# Patient Record
Sex: Male | Born: 1975 | Race: White | Hispanic: Yes | Marital: Married | Smoking: Current every day smoker
Health system: Southern US, Community
[De-identification: ages and names within clinical notes are randomized; demographics above are authoritative.]

## PROBLEM LIST (undated history)

## (undated) DIAGNOSIS — J45909 Unspecified asthma, uncomplicated: Secondary | ICD-10-CM

## (undated) HISTORY — DX: Unspecified asthma, uncomplicated: J45.909

---

## 2015-08-08 ENCOUNTER — Ambulatory Visit (INDEPENDENT_AMBULATORY_CARE_PROVIDER_SITE_OTHER): Payer: Managed Care, Other (non HMO) | Admitting: Family Medicine

## 2015-08-08 VITALS — BP 110/74 | HR 66 | Temp 98.5°F | Resp 16 | Ht 69.0 in | Wt 162.4 lb

## 2015-08-08 DIAGNOSIS — Z720 Tobacco use: Secondary | ICD-10-CM | POA: Diagnosis not present

## 2015-08-08 DIAGNOSIS — J4521 Mild intermittent asthma with (acute) exacerbation: Secondary | ICD-10-CM | POA: Diagnosis not present

## 2015-08-08 DIAGNOSIS — J209 Acute bronchitis, unspecified: Secondary | ICD-10-CM

## 2015-08-08 MED ORDER — AMOXICILLIN 875 MG PO TABS: 875.0000 mg | ORAL_TABLET | Freq: Two times a day (BID) | ORAL | Status: AC

## 2015-08-08 MED ORDER — PREDNISONE 20 MG PO TABS: 40.0000 mg | ORAL_TABLET | Freq: Every day | ORAL | Status: AC

## 2015-08-08 MED ORDER — IPRATROPIUM BROMIDE 0.02 % IN SOLN
0.5000 mg | Freq: Once | RESPIRATORY_TRACT | Status: AC
  Administered 2015-08-08: 0.5 mg via RESPIRATORY_TRACT

## 2015-08-08 MED ORDER — ALBUTEROL SULFATE (2.5 MG/3ML) 0.083% IN NEBU
2.5000 mg | INHALATION_SOLUTION | Freq: Once | RESPIRATORY_TRACT | Status: AC
  Administered 2015-08-08: 2.5 mg via RESPIRATORY_TRACT

## 2015-08-08 MED ORDER — ALBUTEROL SULFATE 108 (90 BASE) MCG/ACT IN AEPB: 2.0000 | INHALATION_SPRAY | RESPIRATORY_TRACT | Status: AC | PRN

## 2015-08-08 MED ORDER — HYDROCOD POLST-CPM POLST ER 10-8 MG/5ML PO SUER: 5.0000 mL | Freq: Two times a day (BID) | ORAL | Status: DC | PRN

## 2015-08-08 NOTE — Patient Instructions (Signed)
Acute Bronchitis °Bronchitis is inflammation of the airways that extend from the windpipe into the lungs (bronchi). The inflammation often causes mucus to develop. This leads to a cough, which is the most common symptom of bronchitis.  °In acute bronchitis, the condition usually develops suddenly and goes away over time, usually in a couple weeks. Smoking, allergies, and asthma can make bronchitis worse. Repeated episodes of bronchitis may cause further lung problems.  °CAUSES °Acute bronchitis is most often caused by the same virus that causes a cold. The virus can spread from person to person (contagious) through coughing, sneezing, and touching contaminated objects. °SIGNS AND SYMPTOMS  °· Cough.   °· Fever.   °· Coughing up mucus.   °· Body aches.   °· Chest congestion.   °· Chills.   °· Shortness of breath.   °· Sore throat.   °DIAGNOSIS  °Acute bronchitis is usually diagnosed through a physical exam. Your health care provider will also ask you questions about your medical history. Tests, such as chest X-rays, are sometimes done to rule out other conditions.  °TREATMENT  °Acute bronchitis usually goes away in a couple weeks. Oftentimes, no medical treatment is necessary. Medicines are sometimes given for relief of fever or cough. Antibiotic medicines are usually not needed but may be prescribed in certain situations. In some cases, an inhaler may be recommended to help reduce shortness of breath and control the cough. A cool mist vaporizer may also be used to help thin bronchial secretions and make it easier to clear the chest.  °HOME CARE INSTRUCTIONS °· Get plenty of rest.   °· Drink enough fluids to keep your urine clear or pale yellow (unless you have a medical condition that requires fluid restriction). Increasing fluids may help thin your respiratory secretions (sputum) and reduce chest congestion, and it will prevent dehydration.   °· Take medicines only as directed by your health care provider. °· If  you were prescribed an antibiotic medicine, finish it all even if you start to feel better. °· Avoid smoking and secondhand smoke. Exposure to cigarette smoke or irritating chemicals will make bronchitis worse. If you are a smoker, consider using nicotine gum or skin patches to help control withdrawal symptoms. Quitting smoking will help your lungs heal faster.   °· Reduce the chances of another bout of acute bronchitis by washing your hands frequently, avoiding people with cold symptoms, and trying not to touch your hands to your mouth, nose, or eyes.   °· Keep all follow-up visits as directed by your health care provider.   °SEEK MEDICAL CARE IF: °Your symptoms do not improve after 1 week of treatment.  °SEEK IMMEDIATE MEDICAL CARE IF: °· You develop an increased fever or chills.   °· You have chest pain.   °· You have severe shortness of breath. °· You have bloody sputum.   °· You develop dehydration. °· You faint or repeatedly feel like you are going to pass out. °· You develop repeated vomiting. °· You develop a severe headache. °MAKE SURE YOU:  °· Understand these instructions. °· Will watch your condition. °· Will get help right away if you are not doing well or get worse. °  °This information is not intended to replace advice given to you by your health care provider. Make sure you discuss any questions you have with your health care provider. °  °Document Released: 09/06/2004 Document Revised: 08/20/2014 Document Reviewed: 01/20/2013 °Elsevier Interactive Patient Education ©2016 [employer] ° °Chronic Obstructive Pulmonary Disease Exacerbation °Chronic obstructive pulmonary disease (COPD) is a common lung condition in which airflow   from the lungs is limited. COPD is a general term that can be used to describe many different lung problems that limit airflow, including chronic bronchitis and emphysema. COPD exacerbations are episodes when breathing symptoms become much worse and require extra treatment.  Without treatment, COPD exacerbations can be life threatening, and frequent COPD exacerbations can cause further damage to your lungs. °CAUSES °· Respiratory infections. °· Exposure to smoke. °· Exposure to air pollution, chemical fumes, or dust. °Sometimes there is no apparent cause or trigger. °RISK FACTORS °· Smoking cigarettes. °· Older age. °· Frequent prior COPD exacerbations. °SIGNS AND SYMPTOMS °· Increased coughing. °· Increased thick spit (sputum) production. °· Increased wheezing. °· Increased shortness of breath. °· Rapid breathing. °· Chest tightness. °DIAGNOSIS °Your medical history, a physical exam, and tests will help your health care provider make a diagnosis. Tests may include: °· A chest X-ray. °· Basic lab tests. °· Sputum testing. °· An arterial blood gas test. °TREATMENT °Depending on the severity of your COPD exacerbation, you may need to be admitted to a hospital for treatment. Some of the treatments commonly used to treat COPD exacerbations are:  °· Antibiotic medicines. °· Bronchodilators. These are drugs that expand the air passages. They may be given with an inhaler or nebulizer. Spacer devices may be needed to help improve drug delivery. °· Corticosteroid medicines. °· Supplemental oxygen therapy. °· Airway clearing techniques, such as noninvasive ventilation (NIV) and positive expiratory pressure (PEP). These provide respiratory support through a mask or other noninvasive device. °HOME CARE INSTRUCTIONS °· Do not smoke. Quitting smoking is very important to prevent COPD from getting worse and exacerbations from happening as often. °· Avoid exposure to all substances that irritate the airway, especially to tobacco smoke. °· If you were prescribed an antibiotic medicine, finish it all even if you start to feel better. °· Take all medicines as directed by your health care provider. It is important to use correct technique with inhaled medicines. °· Drink enough fluids to keep your urine  clear or pale yellow (unless you have a medical condition that requires fluid restriction). °· Use a cool mist vaporizer. This makes it easier to clear your chest when you cough. °· If you have a home nebulizer and oxygen, continue to use them as directed. °· Maintain all necessary vaccinations to prevent infections. °· Exercise regularly. °· Eat a healthy diet. °· Keep all follow-up appointments as directed by your health care provider. °SEEK IMMEDIATE MEDICAL CARE IF: °· You have worsening shortness of breath. °· You have trouble talking. °· You have severe chest pain. °· You have blood in your sputum. °· You have a fever. °· You have weakness, vomit repeatedly, or faint. °· You feel confused. °· You continue to get worse. °MAKE SURE YOU: °· Understand these instructions. °· Will watch your condition. °· Will get help right away if you are not doing well or get worse. °  °This information is not intended to replace advice given to you by your health care provider. Make sure you discuss any questions you have with your health care provider. °  °Document Released: 05/27/2007 Document Revised: 08/20/2014 Document Reviewed: 04/03/2013 °Elsevier Interactive Patient Education ©2016 Elsevier Inc. ° °

## 2015-08-08 NOTE — Progress Notes (Addendum)
Subjective:    Patient ID: Seth Thomas, male    DOB: 01/17/1976, 39 y.o.   MRN: [id number]  By signing my name below, I, Seth Thomas, attest that this documentation has been prepared under the direction and in the presence of Seth SorensonEva Zia Kanner, MD.  Electronically Signed: Littie Deedsichard Thomas, Medical Scribe. 08/08/2015. 2:24 PM.  Chief Complaint  Patient presents with  . Shortness of Breath    x 3 days  . Cough    x 3 days    HPI HPI Comments: Seth SpikeJuan Geraldo is a 39 y.o. male who presents to the [facility] complaining of gradual onset, occasionally productive cough that started 3 days ago. Patient also reports having associated chest tightness, scratchy throat, wheezing, nasal congestion, and rhinorrhea. He also had a subjective fever and chills last night. He has been having difficulty sleeping due to his symptoms. Patient has been taking Nyquil but without relief. He has also used his mother's inhaler which has provided relief. He does have a history of asthma when he was younger and he does admit to smoking 0.5 ppd. Patient also reports having a dental abscess on the left side of his mouth, for which he has been taking amoxicillin q12hrs x 3 doses [place]from [place] but only has 1 more dose.  Past Medical History  Diagnosis Date  . Asthma    No current outpatient prescriptions on file prior to visit.   No current facility-administered medications on file prior to visit.   No Known Allergies   Review of Systems  Constitutional: Positive for fever, chills, diaphoresis, activity change, appetite change and fatigue. Negative for unexpected weight change.  HENT: Positive for congestion, postnasal drip, rhinorrhea and sore throat. Negative for ear pain, facial swelling, sinus pressure and trouble swallowing.   Respiratory: Positive for cough, chest tightness, shortness of breath and wheezing. Negative for choking and stridor.   Cardiovascular: Negative for chest pain.    Gastrointestinal: Negative for vomiting.  Genitourinary: Negative for dysuria.  Skin: Negative for rash.  Hematological: Positive for adenopathy.  Psychiatric/Behavioral: Positive for sleep disturbance.       Objective:  Physical Exam  Constitutional: He is oriented to person, place, and time. He appears well-developed and well-nourished. No distress.  HENT:  Head: Normocephalic and atraumatic.  Right Ear: Tympanic membrane normal.  Left Ear: Tympanic membrane normal.  Mouth/Throat: Posterior oropharyngeal erythema present. No oropharyngeal exudate.  Nasal erythema and edema.  Eyes: Pupils are equal, round, and reactive to light.  Neck: Neck supple.  No lymphadenopathy.  Cardiovascular: Normal rate and regular rhythm.   Pulmonary/Chest: Effort normal.  Coarsened breath sounds, worse in bilateral upper lobes.  Musculoskeletal: He exhibits no edema.  Lymphadenopathy:    He has no cervical adenopathy.  Neurological: He is alert and oriented to person, place, and time. No cranial nerve deficit.  Skin: Skin is warm and dry. No rash noted.  Psychiatric: He has a normal mood and affect. His behavior is normal.  Nursing note and vitals reviewed.  BP 110/74 mmHg  Pulse 66  Temp(Src) 98.5 F (36.9 C) (Oral)  Resp 16  Ht 5\' 9"  (1.753 m)  Wt 162 lb 6.4 oz (73.664 kg)  BMI 23.97 kg/m2  SpO2 98%  Pulm exam and sxs substantially improved after duoneb in office.     Assessment & Plan:   1. Acute bronchitis, unspecified organism   2. Tobacco abuse   3. Reactive airway disease, mild intermittent, with acute exacerbation - h/o  asthma as a child now with ongoing tobacco use so high risk for copd so cover with prednisone 40 qd x 5d due to severity of sxs. Already on day 2 of amoxicillin when due to co-morbidities of tob use/asthma/immunosuppression with prednisone so will complete course of amoxicillin due to pulmonary and dental infection.  Rec dayquil or delsym during day for cough  suppression, tussionex qhs - not while working/driving.  Pt prefers to try NOT to fill tussionex and use otc treatments instead.  Meds ordered this encounter  Medications  . albuterol (PROVENTIL) (2.5 MG/3ML) 0.083% nebulizer solution 2.5 mg    Sig:   . ipratropium (ATROVENT) nebulizer solution 0.5 mg    Sig:   . Albuterol Sulfate (PROAIR RESPICLICK) 108 (90 BASE) MCG/ACT AEPB    Sig: Inhale 2 puffs into the lungs every 4 (four) hours as needed.    Dispense:  1 each    Refill:  2  . chlorpheniramine-HYDROcodone (TUSSIONEX PENNKINETIC ER) 10-8 MG/5ML SUER    Sig: Take 5 mLs by mouth every 12 (twelve) hours as needed for cough.    Dispense:  90 mL    Refill:  0  . amoxicillin (AMOXIL) 875 MG tablet    Sig: Take 1 tablet (875 mg total) by mouth 2 (two) times daily.    Dispense:  20 tablet    Refill:  0  . predniSONE (DELTASONE) 20 MG tablet    Sig: Take 2 tablets (40 mg total) by mouth daily with breakfast.    Dispense:  10 tablet    Refill:  0    I personally performed the services described in this documentation, which was scribed in my presence. The recorded information has been reviewed and considered, and addended by me as needed.  Seth Sorenson, MD MPH

## 2015-08-12 ENCOUNTER — Telehealth: Payer: Self-pay

## 2015-08-12 MED ORDER — HYDROCOD POLST-CPM POLST ER 10-8 MG/5ML PO SUER: 5.0000 mL | Freq: Two times a day (BID) | ORAL | Status: AC | PRN

## 2015-08-12 NOTE — Telephone Encounter (Signed)
Reviewed  csd -no controlled medications filled so reprinted for pt to pick up.

## 2015-08-12 NOTE — Telephone Encounter (Signed)
Pt was seen by Dr. Patsy Lageropland on 12/26 for Acute bronchitis, unspecified organism - Primary. He claims he lost his chlorpheniramine-HYDROcodone [facility]vphen [facility]use[facility] 10-8 MG/5ML [place]isher[place] [[id number]][[id number]] DISCONTINUED script and would like another. CB # [phone]

## 2015-08-12 NOTE — Telephone Encounter (Signed)
Dr Shaw please advise. 

## 2015-08-14 NOTE — Telephone Encounter (Signed)
IC pt @ [phone].  LMOVM that prescription has been written, filed in drawer in front desk, for pt to come pick up.

## 2017-07-28 ENCOUNTER — Encounter (HOSPITAL_BASED_OUTPATIENT_CLINIC_OR_DEPARTMENT_OTHER): Payer: Self-pay | Admitting: Emergency Medicine

## 2017-07-28 ENCOUNTER — Emergency Department (HOSPITAL_BASED_OUTPATIENT_CLINIC_OR_DEPARTMENT_OTHER): Payer: Worker's Compensation

## 2017-07-28 ENCOUNTER — Other Ambulatory Visit: Payer: Self-pay

## 2017-07-28 ENCOUNTER — Emergency Department (HOSPITAL_BASED_OUTPATIENT_CLINIC_OR_DEPARTMENT_OTHER)
Admission: EM | Admit: 2017-07-28 | Discharge: 2017-07-28 | Disposition: A | Discharge status: Home / Self Care | Payer: Worker's Compensation | Attending: Emergency Medicine | Admitting: Emergency Medicine

## 2017-07-28 DIAGNOSIS — M25512 Pain in left shoulder: Secondary | ICD-10-CM | POA: Insufficient documentation

## 2017-07-28 DIAGNOSIS — F17200 Nicotine dependence, unspecified, uncomplicated: Secondary | ICD-10-CM | POA: Diagnosis not present

## 2017-07-28 DIAGNOSIS — J45909 Unspecified asthma, uncomplicated: Secondary | ICD-10-CM | POA: Insufficient documentation

## 2017-07-28 DIAGNOSIS — Z79899 Other long term (current) drug therapy: Secondary | ICD-10-CM | POA: Diagnosis not present

## 2017-07-28 MED ORDER — NAPROXEN 500 MG PO TABS: 500.0000 mg | ORAL_TABLET | Freq: Two times a day (BID) | ORAL | 0 refills | Status: AC

## 2017-07-28 NOTE — ED Triage Notes (Signed)
Pt c/o LT shoulder pain s/p slipping on ice yesterday

## 2017-07-28 NOTE — ED Provider Notes (Signed)
[facility] EMERGENCY DEPARTMENT Provider Note   CSN: [id number] Arrival date & time: 07/28/17  [id number]     History   Chief Complaint Chief Complaint  Patient presents with  . Shoulder Pain    HPI Seth Thomas is a 41 y.o. male.  HPI Seth SpikeJuan Keeler is a 41 y.o. male presents to emergency department complaining of left shoulder injury.  Patient states that he fell while at work.  Patient works in a freezer.  He states he slipped on some ice.  He reports falling onto his left shoulder.  He denies hitting his head or sustaining he states since the injury he is unable to move his left shoulder.  He reports severe pain with any movement.  Denies any numbness or weakness to his arm.  He denies any deformity.  He has taken ibuprofen which did not help.  He does report prior injury to same shoulder, states he is not sure what the injury was, but states that he had to go to a chiropractor and then his pain improved.  Past Medical History:  Diagnosis Date  . Asthma     There are no active problems to display for this patient.   History reviewed. No pertinent surgical history.     Home Medications    Prior to Admission medications   Medication Sig Start Date End Date Taking? Authorizing Provider  Albuterol Sulfate (PROAIR RESPICLICK) 108 (90 BASE) MCG/ACT AEPB Inhale 2 puffs into the lungs every 4 (four) hours as needed. 08/08/15   Sherren MochaShaw, Eva N, MD  amoxicillin (AMOXIL) 875 MG tablet Take 1 tablet (875 mg total) by mouth 2 (two) times daily. 08/08/15   Sherren MochaShaw, Eva N, MD  chlorpheniramine-HYDROcodone [facility](TUSSIONEX PENNKINETIC ER) 10-8 MG/5ML SUER Take 5 mLs by mouth every 12 (twelve) hours as needed. 08/12/15   Sherren MochaShaw, Eva N, MD  predniSONE (DELTASONE) 20 MG tablet Take 2 tablets (40 mg total) by mouth daily with breakfast. 08/08/15   Sherren MochaShaw, Eva N, MD    Family History Family History  Problem Relation Age of Onset  . Diabetes Mother   . Heart disease Mother   . Heart disease  Father     Social History Social History   Tobacco Use  . Smoking status: Current Every Day Smoker  . Smokeless tobacco: Never Used  Substance Use Topics  . Alcohol use: No    Alcohol/week: 0.0 oz  . Drug use: No     Allergies   Patient has no known allergies.   Review of Systems Review of Systems  Constitutional: Negative for chills and fever.  Musculoskeletal: Positive for arthralgias. Negative for back pain.  Neurological: Negative for weakness, numbness and headaches.  All other systems reviewed and are negative.    Physical Exam Updated Vital Signs BP 123/84 (BP Location: Right Arm)   Pulse 78   Temp 98.2 F (36.8 C) (Oral)   Resp 16   Ht 5\' 9"  (1.753 m)   Wt 74.8 kg (165 lb)   SpO2 100%   BMI 24.37 kg/m   Physical Exam  Constitutional: He appears well-developed and well-nourished. No distress.  Eyes: Conjunctivae are normal.  Neck: Neck supple.  Cardiovascular: Normal rate.  Pulmonary/Chest: No respiratory distress.  Abdominal: He exhibits no distension.  Musculoskeletal:  TTP over left posterior shoulder. No deformity. Pain with any active ROM at the shoulder joint. Less pain with passive ROM of shoulder. Pain worse with external rotation. No pain with internal rotation. Normal elbow and wrist.  Distal radial pulse intact. Norma hand grip, bicep and tricep strength.   Skin: Skin is warm and dry.  Nursing note and vitals reviewed.    ED Treatments / Results  Labs (all labs ordered are listed, but only abnormal results are displayed) Labs Reviewed - No data to display  EKG  EKG Interpretation None       Radiology Dg Shoulder Left  Result Date: 07/28/2017 CLINICAL DATA:  Left shoulder pain after fall. EXAM: LEFT SHOULDER - 2+ VIEW COMPARISON:  None FINDINGS: There is no evidence of fracture or dislocation. There is no evidence of arthropathy or other focal bone abnormality. Soft tissues are unremarkable. IMPRESSION: Negative. Electronically  Signed   By: Signa Kellaylor  Stroud M.D.   On: 07/28/2017 10:52    Procedures Procedures (including critical care time)  Medications Ordered in ED Medications - No data to display   Initial Impression / Assessment and Plan / ED Course  I have reviewed the triage vital signs and the nursing notes.  Pertinent labs & imaging results that were available during my care of the patient were reviewed by me and considered in my medical decision making (see chart for details).     Patient in emergency department with left shoulder pain after a fall yesterday.  Injury occurred at work.  Exam initially with no deformity, however significant pain with range of motion.  X-ray obtained and is negative.  Most likely muscular injury, possibly rotator cuff injury.  Will place in a immobilizer.  NSAIDs, ice, rest.  We will have him follow-up with orthopedics or sports medicine.  Return precautions discussed.  At this time I do not think he needs any further imaging or evaluation in the emergency department. He is neurovascularly intact.   Vitals:   07/28/17 0936 07/28/17 1052  BP: 123/84 115/82  Pulse: 78 64  Resp: 16 14  Temp: 98.2 F (36.8 C)   TempSrc: Oral   SpO2: 100% 99%  Weight: 74.8 kg (165 lb)   Height: 5\' 9"  (1.753 m)      Final Clinical Impressions(s) / ED Diagnoses   Final diagnoses:  Acute pain of left shoulder    ED Discharge Orders    None       Jaynie CrumbleKirichenko, Emmer Lillibridge, PA-C 07/28/17 1124    Gwyneth SproutPlunkett, Whitney, MD 07/29/17 [phone]

## 2017-07-28 NOTE — Discharge Instructions (Signed)
Naprosyn for pain and inflammation. Sling for at least a week. No strenuous activity with left arm. Follow up with sports medicine or orthopedics. Return if any worsening symptoms.

## 2017-07-28 NOTE — ED Notes (Signed)
Patient transported to X-ray 

## 2019-07-30 IMAGING — DX DG SHOULDER 2+V*L*
3 series · 3 of 3 positions shown · non-contrast
Comparison: None

CLINICAL DATA: Left shoulder pain after fall.

EXAM:
LEFT SHOULDER - 2+ VIEW

[shoulder grashey]
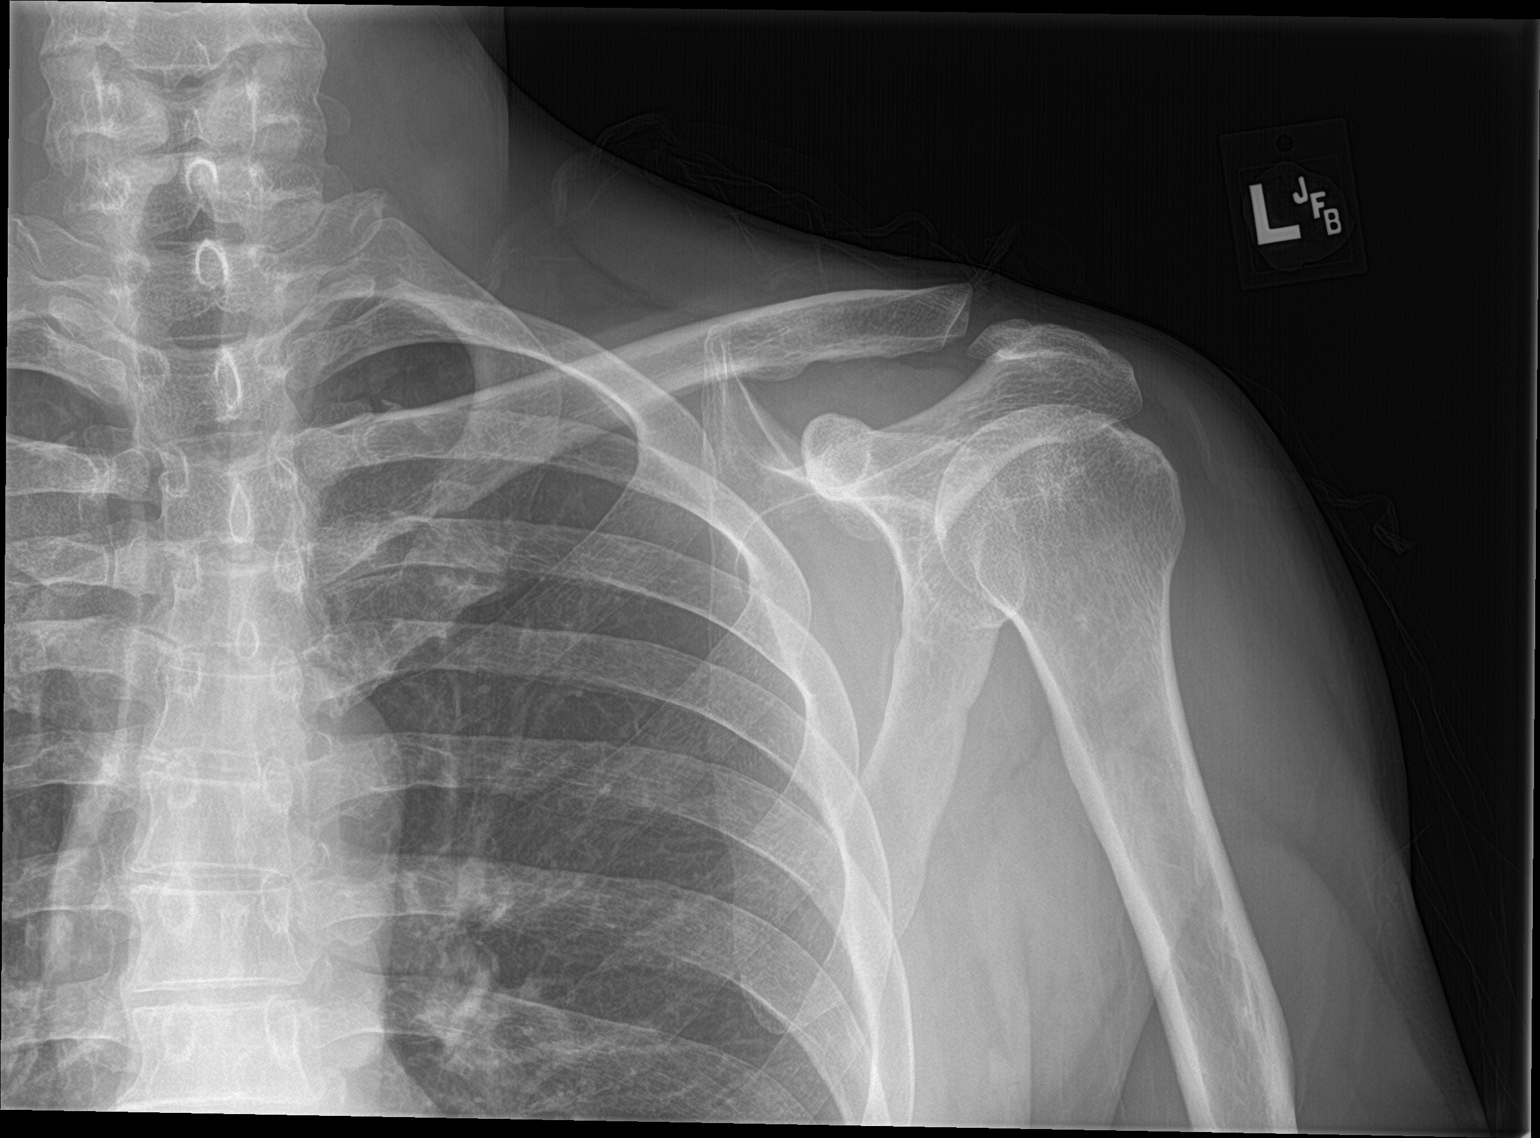

[shoulder y view]
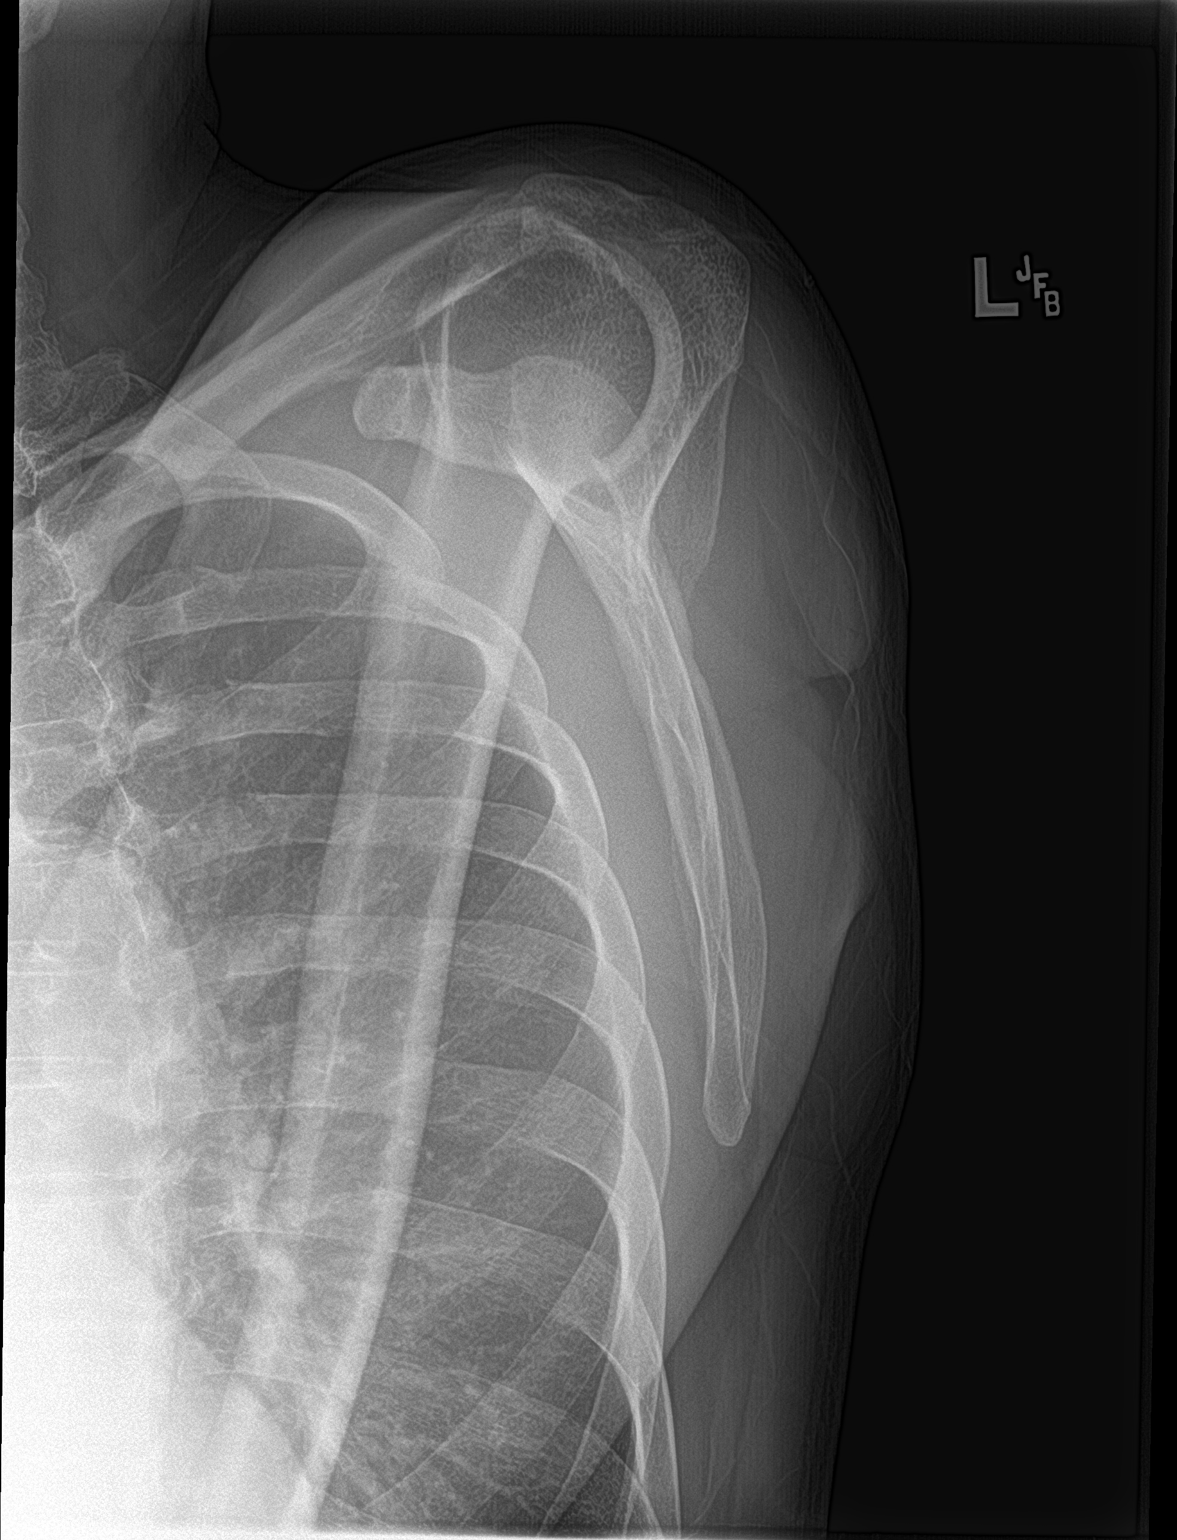

[shoulder axillary]
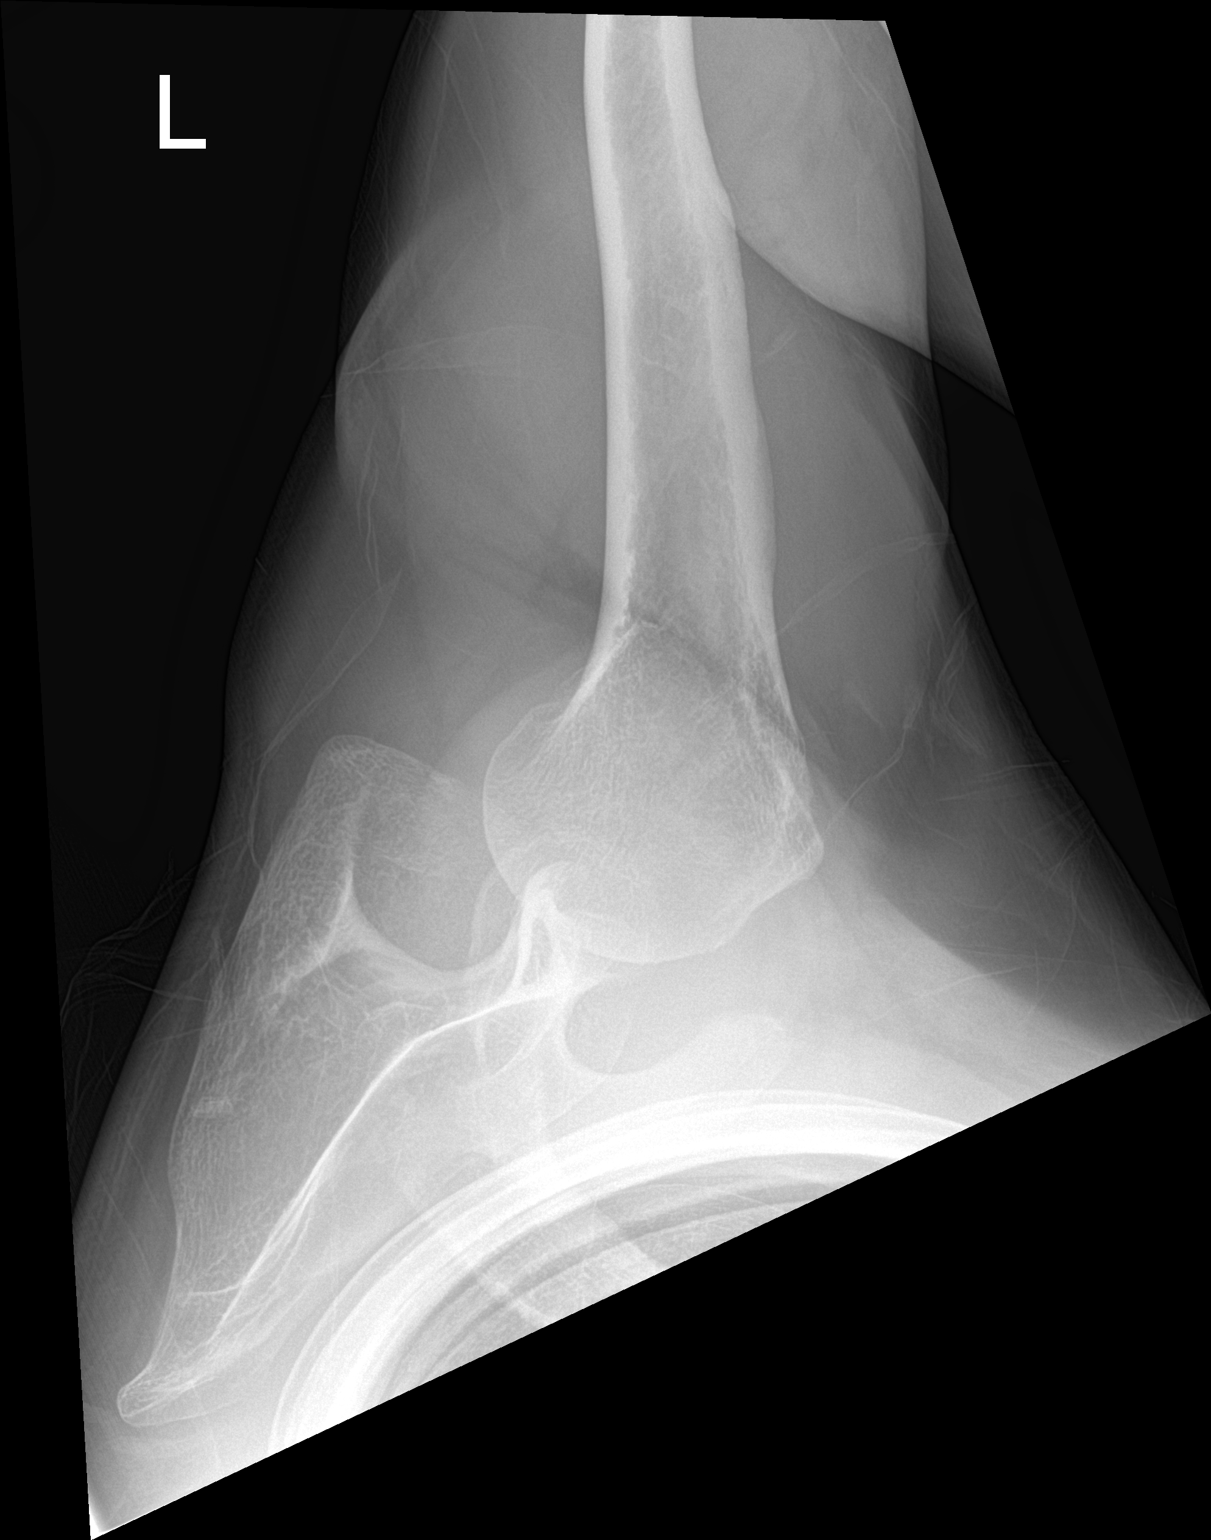

[3 of 3 positions shown; findings below may reference images not displayed]

FINDINGS: There is no evidence of fracture or dislocation. There is no
evidence of arthropathy or other focal bone abnormality. Soft
tissues are unremarkable.
IMPRESSION: Negative.
